# Patient Record
Sex: Male | Born: 1960 | Race: Black or African American | Hispanic: No | Marital: Single | Smoking: Former smoker
Health system: Southern US, Community
[De-identification: ages and names within clinical notes are randomized; demographics above are authoritative.]

## PROBLEM LIST (undated history)

## (undated) DIAGNOSIS — E7800 Pure hypercholesterolemia, unspecified: Secondary | ICD-10-CM

## (undated) DIAGNOSIS — E119 Type 2 diabetes mellitus without complications: Secondary | ICD-10-CM

## (undated) HISTORY — PX: BACK SURGERY: SHX140

## (undated) HISTORY — DX: Pure hypercholesterolemia, unspecified: E78.00

---

## 1999-05-20 ENCOUNTER — Encounter (INDEPENDENT_AMBULATORY_CARE_PROVIDER_SITE_OTHER): Payer: Self-pay | Admitting: Specialist

## 1999-05-20 ENCOUNTER — Observation Stay (HOSPITAL_COMMUNITY): Admission: RE | Admit: 1999-05-20 | Discharge: 1999-05-21 | Payer: Self-pay | Admitting: Specialist

## 1999-05-20 ENCOUNTER — Encounter: Payer: Self-pay | Admitting: Specialist

## 2000-11-25 ENCOUNTER — Other Ambulatory Visit: Admission: RE | Admit: 2000-11-25 | Discharge: 2000-11-25 | Payer: Self-pay | Admitting: Podiatry

## 2003-04-01 ENCOUNTER — Emergency Department (HOSPITAL_COMMUNITY): Admission: EM | Admit: 2003-04-01 | Discharge: 2003-04-02 | Payer: Self-pay | Admitting: Emergency Medicine

## 2003-04-01 ENCOUNTER — Encounter: Payer: Self-pay | Admitting: Emergency Medicine

## 2008-03-26 ENCOUNTER — Observation Stay (HOSPITAL_COMMUNITY): Admission: EM | Admit: 2008-03-26 | Discharge: 2008-03-28 | Payer: Self-pay | Admitting: Emergency Medicine

## 2008-03-27 ENCOUNTER — Ambulatory Visit: Payer: Self-pay | Admitting: Internal Medicine

## 2008-03-28 ENCOUNTER — Encounter: Payer: Self-pay | Admitting: Cardiology

## 2009-02-14 ENCOUNTER — Ambulatory Visit (HOSPITAL_COMMUNITY): Admission: RE | Admit: 2009-02-14 | Discharge: 2009-02-14 | Payer: Self-pay | Admitting: Surgery

## 2011-03-11 LAB — BASIC METABOLIC PANEL
BUN: 24 mg/dL — ABNORMAL HIGH (ref 6–23)
CO2: 25 mEq/L (ref 19–32)
Calcium: 8.6 mg/dL (ref 8.4–10.5)
Chloride: 104 mEq/L (ref 96–112)
Creatinine, Ser: 1.14 mg/dL (ref 0.4–1.5)
GFR calc Af Amer: >60 mL/min (ref >60)
GFR calc non Af Amer: >60 mL/min (ref >60)
Glucose, Bld: 104 mg/dL — ABNORMAL HIGH (ref 70–99)
Potassium: 3.9 mEq/L (ref 3.5–5.1)
Sodium: 135 mEq/L (ref 135–145)

## 2011-03-11 LAB — HEMOGLOBIN AND HEMATOCRIT, BLOOD
HCT: 44.1 % (ref 39.0–52.0)
Hemoglobin: 15.1 g/dL (ref 13.0–17.0)

## 2011-03-11 LAB — GLUCOSE, CAPILLARY: Glucose-Capillary: 90 mg/dL (ref 70–99)

## 2011-04-13 NOTE — H&P (Signed)
NAMEDELBERT, DARLEY NO.:  [id number]   MEDICAL RECORD NO.:  [id number]          PATIENT TYPE:  EMS   LOCATION:  ED                           FACILITY:  [facility]   PHYSICIAN:  Madaline Savage, MD        DATE OF BIRTH:  Oct 03, 1961   DATE OF ADMISSION:  03/26/2008  DATE OF DISCHARGE:                              HISTORY & PHYSICAL   PRIMARY CARE PHYSICIAN:  [facility].  This patient is unassigned to [place].   CHIEF COMPLAINTS:  Chest pain and palpitations.   HISTORY OF PRESENT ILLNESS:  Mr. James Ingram is a 50 year old gentleman with  no significant past medical history, who comes in with chest pain.  He  states the chest pain came on 2 days ago when he was resting on his  couch.  The chest pain was central, it was sharp.  He rated it 5/10, it  had no radiation.  He denies any shortness of breath, any nausea, any  diaphoresis or any sweating with it.  There were aggravating or  relieving factors with the pain.  He thought that the pain was secondary  to gas at that time and so took some water and the pain resolved in 5  minutes.  Since then, he has had episodes of palpitations which lasted  several minutes.  These used to come on and off.  He had an episode of  palpitation while he was in the ER, and at that time his rhythm was  normal sinus rhythm at a rate of 80 per minute.  He states he has been  anxious because of the chest pain.   PAST MEDICAL HISTORY:  None.   PAST SURGICAL HISTORY:  History of back surgery several years ago.   ALLERGIES:  NO KNOWN DRUG ALLERGIES.   CURRENT MEDICATIONS:  None.   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  He smokes  approximately one pack of cigarettes a day and has smoked for 20 years.  He takes alcohol occasionally.  He denies any history of drug abuse.   FAMILY HISTORY:  His father died at the age of 79 of a heart attack.  His mother is 52, she is healthy.   REVIEW OF SYSTEMS:  GENERAL:  He denies recent weight  loss or weight  gain.  No fever or chills.  HEENT:  No headaches, no blurred vision.  No  sore throat.  CARDIOVASCULAR:  Does not have any chest pain now, does  have palpitations occasionally.  RESPIRATORY:  No shortness of breath or  cough.  GI:  No abdominal pain, nausea, vomiting, diarrhea or  constipation.   PHYSICAL EXAMINATION:  GENERAL:  He is alert and oriented x3.  VITAL SIGNS:  Temperature 97.7, pulse rate 76, blood pressure is 137/86,  respiratory rate 16, oxygen saturation 99% on 2 liters.  HEENT:  Head atraumatic, normocephalic.  Pupils bilaterally equal and  reactive to light.  Mucous membranes are moist.  NECK:  Supple.  No JVD, no carotid bruit.  CARDIOVASCULAR:  S1-S2 heard.  Regular rate and rhythm.  No murmurs or  gallops.  CHEST:  Clear to auscultation.  ABDOMEN:  Soft.  Bowel sounds heard.  EXTREMITIES:  No edema, cyanosis or clubbing.   LABORATORY DATA:  Show a white count of 13.5, hemoglobin 15.8, platelets  244.  Sodium 136, potassium 3.3, chloride 99, bicarb 26, BUN 10,  creatinine 1.9, glucose of 190, troponin is less than 0.05.  EKG shows  normal sinus rhythm.  X-ray of the chest shows no acute findings.  Urinalysis is negative.   IMPRESSION:  1. Atypical chest pain.  2. Mild hyperglycemia.  3. Leukocytosis.  4. Hypokalemia.  5. Tobacco use.   PLAN:  This is a 50 year old gentleman who has a history of smoking who  comes in with atypical chest pain.  He is chest pain free at this time.  He also had a palpitations.  In the ED, he was in normal sinus rhythm  while he had these palpitations.  On exam, he does not have any murmurs  at this time.  I will keep him here for 23 observation and get three  sets of cardiac markers on him.  He does have a slightly elevated sugar  of 190.  He will get a fasting blood sugar along with his BNP tomorrow  morning to find if he does have diabetes.  I will also get hemoglobin  A1c on him.  I will also get TSH and a  fasting lipid profile.  He does  have a slightly elevated white cell count, which could be a stress  reaction.  We will follow it up with a.m. labs.  His potassium is on the  lower side.  Will replace the potassium and will give him smoking  cessation counseling.  I will put him on DVT prophylaxis.      Madaline Savage, MD  Electronically Signed     PKN/MEDQ  D:  03/26/2008  T:  03/26/2008  Job:  [id number]

## 2011-04-13 NOTE — Discharge Summary (Signed)
NAMEDAL, BLEW NO.:  [id number]   MEDICAL RECORD NO.:  [id number]          PATIENT TYPE:  OUT   LOCATION:  NUC                          FACILITY:  [facility]   PHYSICIAN:  Altha Harm, MDDATE OF BIRTH:  10-01-1961   DATE OF ADMISSION:  03/28/2008  DATE OF DISCHARGE:  03/28/2008                               DISCHARGE SUMMARY   DISCHARGE DISPOSITION:  Home.   FINAL DISCHARGE DIAGNOSES:  1. Chest pain noncardiac.  2. Diabetes type 2, new diagnosis.  3. Hyperlipidemia, new diagnosis.  4. Stage I hypertension.   DISCHARGE MEDICATIONS:  1. Zocor 40 mg p.o. nightly.  2. Glucophage 250 mg p.o. b.i.d.  3. Aspirin 81 mg p.o. daily.  4. Lisinopril 10 mg p.o. daily.   PROCEDURES:  None.   DIAGNOSTIC STUDIES:  1. Treadmill Myoview which shows no ischemia and normal ejection      fraction.  2. Chest x-ray two-view done on admission which shows no acute chest      findings.   CONSULTANTS:  [facility] for stress test procedure.   CHIEF COMPLAINT:  Chest pain and palpitations.   HISTORY OF PRESENT ILLNESS:  Please see the H&P dictated by Dr. Oneita Hurt for details of the HPI.   HOSPITAL COURSE:  1. The patient was hospitalized and ruled out with serial enzymes for      resting ischemia.  The patient then underwent a stress Myoview,      which shows no ischemia and normal ejection fraction.  In the      course of the workup, the patient was found to have hyperlipidemia      with an LDL elevated to 111 and low HDL of 37.  The patient was      started on Zocor for cardiac protection as well as risk factor      modification.  The patient also was found to have a borderline      hemoglobin A1c at 6.2.  This was discussed with the patient between      diet modification and low dose Glucophage.  The patient has opted      for low-dose Glucophage and is being prescribed a Glucometer to      monitor his blood sugars.  2. Stage I hypertension.  The  patient is started on lisinopril 10 mg      p.o. daily.   The patient has been stable throughout his hospital stay and without any  incident.  The patient has tolerated his meals well.  He is ambulating  without any difficulty and he has been pain-free for 48 hours.   PLAN:  Is to discharge the patient home today.   DIETARY RESTRICTIONS:  The patient should be on a diabetic heart-healthy  diet.  He has been instructed in diet modification.   PHYSICAL RESTRICTIONS:  None.   FOLLOWUP:  The patient coming into the hospital did not have a primary  care physician.  However, the patient will make an appointment to follow  up with Dr. Lovell Sheehan, phone number [phone].      Marcelino Duster  Neita Garnet, MD  Electronically Signed     MAM/MEDQ  D:  03/28/2008  T:  03/28/2008  Job:  [id number]   cc:   Dr. Lovell Sheehan

## 2011-04-13 NOTE — Op Note (Signed)
James Ingram, James Ingram NO.:  [id number]   MEDICAL RECORD NO.:  [id number]          PATIENT TYPE:  AMB   LOCATION:  DAY                          FACILITY:  [facility]   PHYSICIAN:  Ardeth Sportsman, MD     DATE OF BIRTH:  [birth year]   DATE OF PROCEDURE:  02/14/2009  DATE OF DISCHARGE:                               OPERATIVE REPORT   PRIMARY CARE PHYSICIAN:  Della Goo, M.D.   SURGEON:  Karie Soda, M.D.   PREOPERATIVE DIAGNOSES:  1. Left inguinal hernia.  2. Possible small right inguinal hernia.  3. Umbilical hernia.   POSTOPERATIVE DIAGNOSES:  1. Bilateral inguinal herniae.  2. Umbilical hernia.   PROCEDURES PERFORMED:  1. Laparoscopic bilateral preperitoneal exploration with hernia      repairs with mesh.  2. Primary umbilical hernia repair.   ANESTHESIA:  1. General anesthesia.  2. Bilateral ilioinguinal/genitofemoral/spermatic cord nerve blocks.   DRAINS:  None.   ESTIMATED BLOOD LOSS:  30 mL.   COMPLICATIONS:  None apparent.   INDICATIONS:  James Ingram is a pleasant 50 year old male who is rather  physically active.  He has had an enlarging left groin mass with some  discomfort with it.  He normally is very physically active and it has  limited his ability to do his job, which involves a lot of heavy  lifting.  On my initial examination I did not detect a right inguinal  hernia, but today I feel an area that is suspicious.  He has a small  hernia through his umbilical stalk as well.   Anatomy and physiology of abdominal formation and testicular migration  was explained.  Pathophysiology of herniation with its natural history  of risks were discussed.  Options were discussed.  Recommendation was  made for laparoscopic bilateral inguinal exploration with hernia repairs  as needed and umbilical hernia repair.  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  Risks and benefits,  alternatives were discussed.  Questions were answered.  He agreed to proceed.   OPERATIVE FINDINGS:  He had a moderately enlarged left inguinal hernia  with a moderate-sized spermatic cord lipoma in the indirect region.  He  did not have any significant direct defects.  On the right he had a  small, but definite, indirect defect as well.  He had a umbilical hernia  through the stalk itself, about 5 mm in size.   DESCRIPTION OF PROCEDURE:  Consent was confirmed.  The patient voided in  short-stay, but was not able to void just prior to surgery.  He received  IV antibiotics just prior to surgery.  He had sequential pressure  devices active during the entire case.  He underwent general anesthesia  without any difficulty.  He was positioned supine, both arms tucked.  His abdomen and perineum were clipped, prepped and draped in a sterile  fashion.  Surgical time-out confirmed our plan.   Entry was gained to the abdominal wall through an infraumbilical  curvilinear incision.  A nick was made  through the anterior the fascia,  just to the right of midline, and I was able to place a 12-mm Hasson  port in the right retrorectal/preperitoneal plane.  Capnopreperitoneum  to 15 mmHg provided good intra-abdominal wall insufflation.  Camera  inspection revealed correct placement.  Camera dissection was used to  help free the peritoneum off bilateral lower quadrants.  Enough working  space was created such that 5 mm ports could be placed in the right and  left midabdomen.   Attention was turned towards the left side since that was the more  obvious side.  Peritoneum was freed off laterally.  In doing that, there  was a tear in the peritoneum during dissection.  This was closed using a  3-0 Vicryl stitch, using laparoscopic intracorporeal suturing.  Peritoneum was followed up towards the internal ring and an obvious  indirect hernia could be seen.  The hernia sac was freed off the cord  structures and  peeled back and reduced out of the inguinal canal.  A  moderate-sized spermatic cord was not as well and this was carefully  peeled off and reduced back out, as well.  Peritoneum was peeled off the  cord structures and the retroperitoneum as proximally as possible.  A  window was made between the lateral bladder and the anteromedial pelvic  wall.  The bladder was rather full.   Attention was turned toward the right side.  Dissection was carried out  in a mirror-image fashion.  There was a a more subtle hernia sac, but it  was definitely going into the internal ring.  This was an indirect  hernia as well.  There was no evidence of any direct femoral defects on  the right either.  A small tear was made in the hernia sac during  dissection.  This was closed laparoscopically using a 3-0 Vicryl stitch  and using intracorporeal suturing to good result.   A 15 x 15 cm ultra-lightweight polypropylene (Ultrapro) mesh was used to  each side.  It was cut with a slit and into half-skull shape.  It was  positioned such that a 3 x 3 cm medial inferior flap rested in the true  pelvis between the lateral bladder wall and medial pelvic wall.  The  mesh laid well proximally, laterally, superiorly and medially, such that  there was 3 inches circumferential coverage around the internal ring.  Mesh was placed in a mirror-image fashion on the right side as well.  The mesh was held down as hernia sacs were pulled cephalad with the mesh  held in place as capnopreperitoneum was evacuated.  Ports were removed.   Attention was turned to the umbilical hernia repair.  Dissection was  used to help free the umbilical stalk circumferentially.  Umbilical  stalk was freed off the fascia.  In doing that, there was obvious hernia  that had gone through the umbilical stalk that had some omentum within  it, but it was not incarcerated.  The hernia was reduced down.  It was  closed using 0 Vicryl interrupted stitches to good  result.  The fascial  defect for the Hasson port was closed with 0Vicryl stitch as well.  The  umbilical stalk was re-tacked to the fascia using a 4-0 Monocryl stitch.  Skin was closed using 4-0 Monocryl stitch.  Sterile dressing was  applied.   Given the fact he a very large bladder, he had I and O catheterization  done at the end of the procedure to  have his bladder nice and  decompressed.  He was extubated and taken to the recovery room in stable  condition.  He tolerated the procedure well.   I discussed postoperative care with the patient in detail in our office  prior to surgery and just prior to surgery in the holding area and will  discuss with his family as well, per his wishes.      Ardeth Sportsman, MD  Electronically Signed     SCG/MEDQ  D:  02/14/2009  T:  02/14/2009  Job:  [id number]   cc:   Della Goo, M.D.  Fax: [phone]

## 2011-08-24 LAB — CBC
HCT: 45.5
HCT: 45.8
Hemoglobin: 15.1
Hemoglobin: 15.8
MCHC: 33.3
MCHC: 34.5
MCV: 92.3
MCV: 92.4
Platelets: 244
Platelets: 250
RBC: 4.93
RBC: 4.96
RDW: 13.7
RDW: 14
WBC: 13.5 — ABNORMAL HIGH
WBC: 9.4

## 2011-08-24 LAB — TROPONIN I: Troponin I: 0.04

## 2011-08-24 LAB — CARDIAC PANEL(CRET KIN+CKTOT+MB+TROPI)
CK, MB: 1.2
CK, MB: 1.5
CK, MB: 1.5
Relative Index: 0.6
Relative Index: 0.7
Relative Index: 1.1
Total CK: 114
Total CK: 210
Total CK: 231
Troponin I: 0.02
Troponin I: 0.03
Troponin I: 0.04

## 2011-08-24 LAB — HEMOGLOBIN A1C
Hgb A1c MFr Bld: 6.2 — ABNORMAL HIGH
Mean Plasma Glucose: 143

## 2011-08-24 LAB — DIFFERENTIAL
Basophils Absolute: 0
Basophils Absolute: 0.1
Basophils Relative: 1
Basophils Relative: 1
Eosinophils Absolute: 0.1
Eosinophils Absolute: 0.2
Eosinophils Relative: 1
Eosinophils Relative: 2
Lymphocytes Relative: 16
Lymphocytes Relative: 38
Lymphs Abs: 2.1
Lymphs Abs: 3.6
Monocytes Absolute: 0.9
Monocytes Absolute: 1
Monocytes Relative: 10
Monocytes Relative: 8
Neutro Abs: 10.1 — ABNORMAL HIGH
Neutro Abs: 4.7
Neutrophils Relative %: 50
Neutrophils Relative %: 75

## 2011-08-24 LAB — URINALYSIS, ROUTINE W REFLEX MICROSCOPIC
Glucose, UA: NEGATIVE
Ketones, ur: >80 — AB
Leukocytes, UA: NEGATIVE
Nitrite: NEGATIVE
Protein, ur: NEGATIVE
Specific Gravity, Urine: 1.026
Urobilinogen, UA: 1
pH: 5.5

## 2011-08-24 LAB — BASIC METABOLIC PANEL
BUN: 10
BUN: 7
CO2: 26
CO2: 28
Calcium: 8.8
Calcium: 9
Chloride: 105
Chloride: 99
Creatinine, Ser: 1.01
Creatinine, Ser: 1.1
GFR calc Af Amer: >60
GFR calc Af Amer: >60
GFR calc non Af Amer: >60
GFR calc non Af Amer: >60
Glucose, Bld: 190 — ABNORMAL HIGH
Glucose, Bld: 95
Potassium: 3.3 — ABNORMAL LOW
Potassium: 4
Sodium: 136
Sodium: 142

## 2011-08-24 LAB — URINE MICROSCOPIC-ADD ON

## 2011-08-24 LAB — POCT CARDIAC MARKERS
CKMB, poc: 1.2
Myoglobin, poc: 63
Operator id: 3067
Troponin i, poc: <0.05

## 2011-08-24 LAB — CK TOTAL AND CKMB (NOT AT ARMC)
CK, MB: 2.2
Relative Index: 1
Total CK: 226

## 2011-08-24 LAB — LIPID PANEL
Cholesterol: 176
HDL: 37 — ABNORMAL LOW
LDL Cholesterol: 111 — ABNORMAL HIGH
Total CHOL/HDL Ratio: 4.8
Triglycerides: 139
VLDL: 28

## 2011-08-24 LAB — TSH: TSH: 2.196

## 2011-12-13 ENCOUNTER — Telehealth (INDEPENDENT_AMBULATORY_CARE_PROVIDER_SITE_OTHER): Payer: Self-pay

## 2011-12-15 NOTE — Telephone Encounter (Signed)
Called patient to answer any pre-surgical questions.

## 2012-06-22 ENCOUNTER — Other Ambulatory Visit: Payer: Self-pay | Admitting: Internal Medicine

## 2012-06-22 DIAGNOSIS — K829 Disease of gallbladder, unspecified: Secondary | ICD-10-CM

## 2012-06-22 DIAGNOSIS — K8689 Other specified diseases of pancreas: Secondary | ICD-10-CM

## 2012-06-22 DIAGNOSIS — K859 Acute pancreatitis without necrosis or infection, unspecified: Secondary | ICD-10-CM

## 2012-06-26 ENCOUNTER — Ambulatory Visit
Admission: RE | Admit: 2012-06-26 | Discharge: 2012-06-26 | Disposition: A | Discharge status: Home / Self Care | Payer: BC Managed Care – PPO | Source: Ambulatory Visit | Attending: Internal Medicine | Admitting: Internal Medicine

## 2012-06-26 DIAGNOSIS — K859 Acute pancreatitis without necrosis or infection, unspecified: Secondary | ICD-10-CM

## 2012-06-26 DIAGNOSIS — K829 Disease of gallbladder, unspecified: Secondary | ICD-10-CM

## 2012-06-26 DIAGNOSIS — K8689 Other specified diseases of pancreas: Secondary | ICD-10-CM

## 2015-06-24 ENCOUNTER — Encounter: Payer: Self-pay | Admitting: Podiatry

## 2015-06-24 ENCOUNTER — Ambulatory Visit (INDEPENDENT_AMBULATORY_CARE_PROVIDER_SITE_OTHER): Payer: BLUE CROSS/BLUE SHIELD | Admitting: Podiatry

## 2015-06-24 VITALS — BP 114/80 | HR 66 | Resp 18

## 2015-06-24 DIAGNOSIS — L84 Corns and callosities: Secondary | ICD-10-CM

## 2015-06-24 NOTE — Progress Notes (Signed)
   Subjective:    Patient ID: James Ingram, male    DOB: 1960-12-16, 54 y.o.   MRN: [id number]  HPI 4TH AND 5TH TOES HAD A SPLIT ABOUT A MONTH AGO AND THEY ARE BETTER AND THE 2ND AND 3RD TOES THERE IS A SPLIT IN BETWEEN THEM AND IT HAS SOME DRYNESS AND PEELING AND CRACKING TO IT AND HAS HAPPENED Sunday AND USED NEOSPORIN This patient presents to the office saying he has break in skin between his second and third toes left foot.  He admits to skin hydration problems.He says he previously had surgery 4,5 right foot and last month had similar problem 4,5 left foot. He has been applying neosporin to the site of the skin lesion.He presents for evaluation and treatment.   Review of Systems  All other systems reviewed and are negative.      Objective:   Physical Exam GENERAL APPEARANCE: Alert, conversant. Appropriately groomed. No acute distress.  VASCULAR: Pedal pulses palpable at 2/4 DP and PT bilateral.  Capillary refill time is immediate to all digits,  Proximal to distal cooling it warm to warm.  Digital hair growth is present bilateral  NEUROLOGIC: sensation is intact epicritically and protectively to 5.07 monofilament at 5/5 sites bilateral.  Light touch is intact bilateral, vibratory sensation intact bilateral, achilles tendon reflex is intact bilateral.  MUSCULOSKELETAL: acceptable muscle strength, tone and stability bilateral.  Intrinsic muscluature intact bilateral.  Rectus appearance of foot and digits noted bilateral.   DERMATOLOGIC: skin color, texture, and turgor are within normal limits.  No preulcerative lesions or ulcers  are seen, no interdigital maceration noted.  There is interspace lesion in second interspace left foot.  No redness or swelling or infection noted.  Digital nails are asymptomatic. No drainage noted.         Assessment & Plan:  Interdigital ulceration left foot.  IE.  Disvcussed condition with patient.  Neosporin/DSD applied.

## 2015-10-20 ENCOUNTER — Ambulatory Visit
Admission: RE | Admit: 2015-10-20 | Discharge: 2015-10-20 | Disposition: A | Discharge status: Home / Self Care | Payer: BLUE CROSS/BLUE SHIELD | Source: Ambulatory Visit | Attending: Internal Medicine | Admitting: Internal Medicine

## 2015-10-20 ENCOUNTER — Other Ambulatory Visit: Payer: Self-pay | Admitting: Internal Medicine

## 2015-10-20 DIAGNOSIS — R05 Cough: Secondary | ICD-10-CM

## 2015-10-20 DIAGNOSIS — R059 Cough, unspecified: Secondary | ICD-10-CM

## 2018-02-20 ENCOUNTER — Other Ambulatory Visit: Payer: Self-pay | Admitting: Internal Medicine

## 2018-02-20 DIAGNOSIS — Z87891 Personal history of nicotine dependence: Secondary | ICD-10-CM

## 2018-03-06 ENCOUNTER — Ambulatory Visit
Admission: RE | Admit: 2018-03-06 | Discharge: 2018-03-06 | Disposition: A | Discharge status: Home / Self Care | Payer: BLUE CROSS/BLUE SHIELD | Source: Ambulatory Visit | Attending: Internal Medicine | Admitting: Internal Medicine

## 2018-03-06 DIAGNOSIS — Z87891 Personal history of nicotine dependence: Secondary | ICD-10-CM

## 2019-07-02 ENCOUNTER — Other Ambulatory Visit: Payer: Self-pay | Admitting: Internal Medicine

## 2019-07-02 DIAGNOSIS — Z122 Encounter for screening for malignant neoplasm of respiratory organs: Secondary | ICD-10-CM

## 2019-08-03 ENCOUNTER — Ambulatory Visit
Admission: RE | Admit: 2019-08-03 | Discharge: 2019-08-03 | Disposition: A | Discharge status: Home / Self Care | Payer: BC Managed Care – PPO | Source: Ambulatory Visit | Attending: Internal Medicine | Admitting: Internal Medicine

## 2019-08-03 ENCOUNTER — Other Ambulatory Visit: Payer: Self-pay

## 2019-08-03 DIAGNOSIS — Z122 Encounter for screening for malignant neoplasm of respiratory organs: Secondary | ICD-10-CM

## 2021-02-06 ENCOUNTER — Other Ambulatory Visit: Payer: Self-pay | Admitting: Internal Medicine

## 2021-02-09 ENCOUNTER — Other Ambulatory Visit: Payer: Self-pay | Admitting: Internal Medicine

## 2021-02-09 DIAGNOSIS — F17200 Nicotine dependence, unspecified, uncomplicated: Secondary | ICD-10-CM

## 2021-03-02 ENCOUNTER — Inpatient Hospital Stay: Admission: RE | Admit: 2021-03-02 | Payer: BC Managed Care – PPO | Source: Ambulatory Visit

## 2021-03-02 ENCOUNTER — Ambulatory Visit
Admission: RE | Admit: 2021-03-02 | Discharge: 2021-03-02 | Disposition: A | Discharge status: Home / Self Care | Payer: BC Managed Care – PPO | Source: Ambulatory Visit | Attending: Internal Medicine | Admitting: Internal Medicine

## 2021-03-02 DIAGNOSIS — F17200 Nicotine dependence, unspecified, uncomplicated: Secondary | ICD-10-CM

## 2022-08-13 IMAGING — CT CT CHEST LUNG CANCER SCREENING LOW DOSE W/O CM
2 of 5 series · 14 of 40 positions shown, 17 images · non-contrast
Comparison: Low-dose lung cancer screening chest CT 08/03/2019.

CLINICAL DATA: 59-year-old male current smoker with 36 pack-year
history of smoking. Lung cancer screening examination.

EXAM:
CT CHEST WITHOUT CONTRAST LOW-DOSE FOR LUNG CANCER SCREENING
TECHNIQUE: Multidetector CT imaging of the chest was performed following the
standard protocol without IV contrast.

[Series 4: lung 1.00 br44 cor · coronal · 0.65mm/px · 3 of 325 slices shown]
[im 65/325  lung]
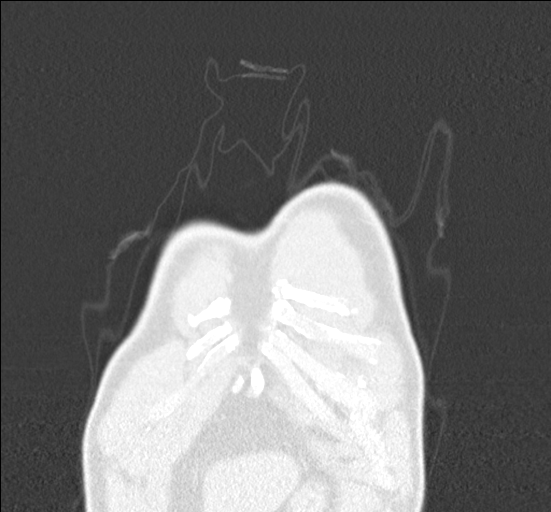
[im 130/325  lung]
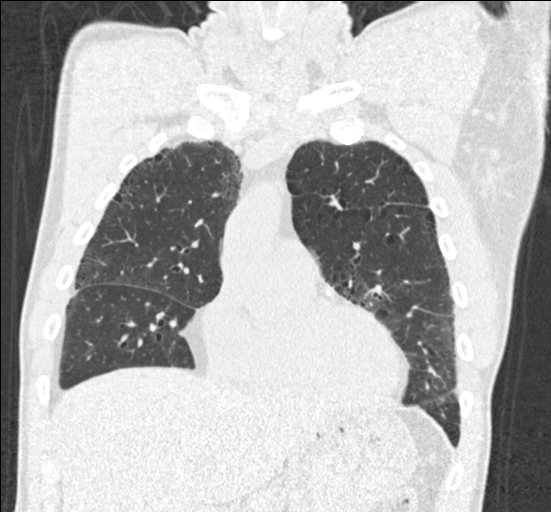
[im 195/325  lung]
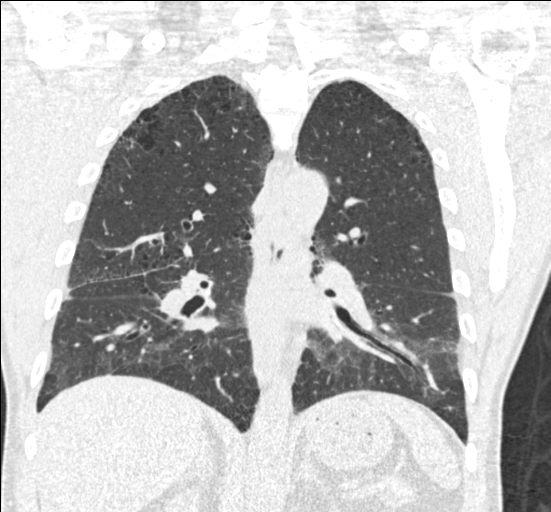

[Series 9: lung 1.00 br60 axial · axial · 0.70mm/px · z∈[-1178,-878]mm · 11 of 332 slices shown, 14 images]
[im 16/332  mediastinal]
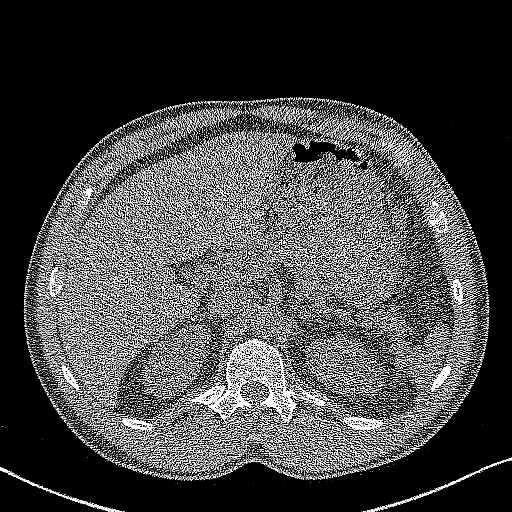
[im 16/332  lung]
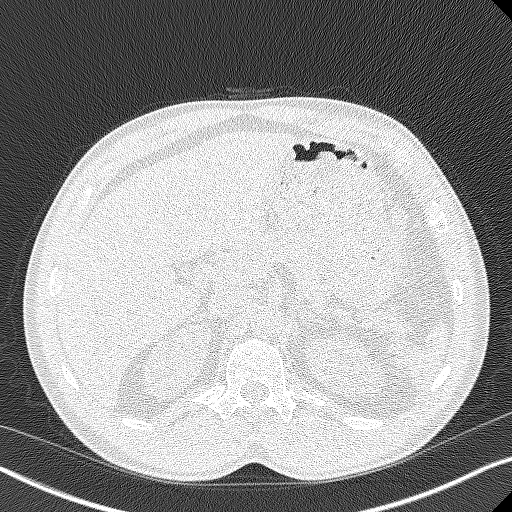
[im 46/332  lung]
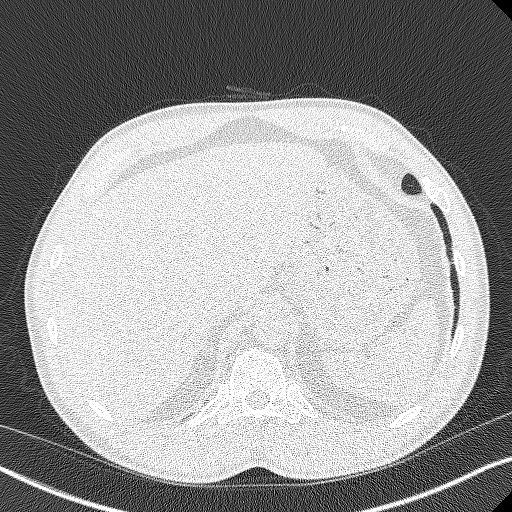
[im 76/332  lung]
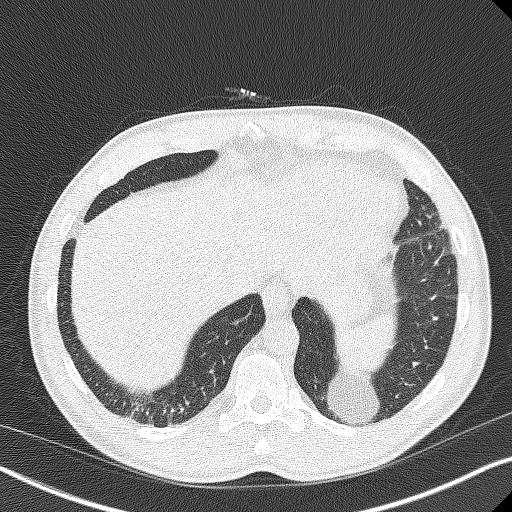
[im 106/332  lung]
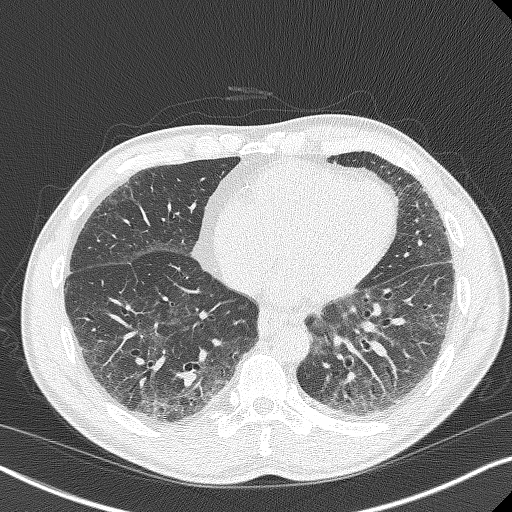
[im 136/332  mediastinal]
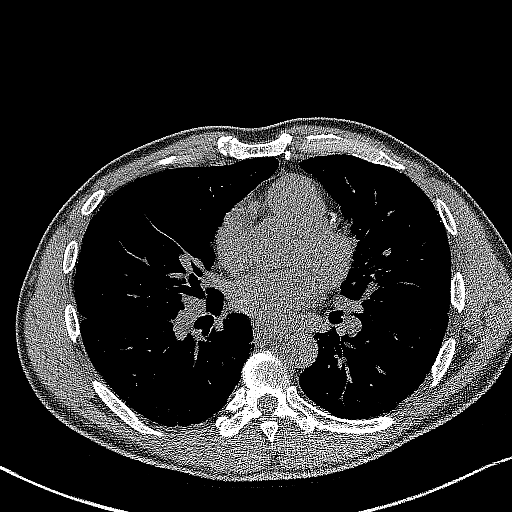
[im 136/332  lung]
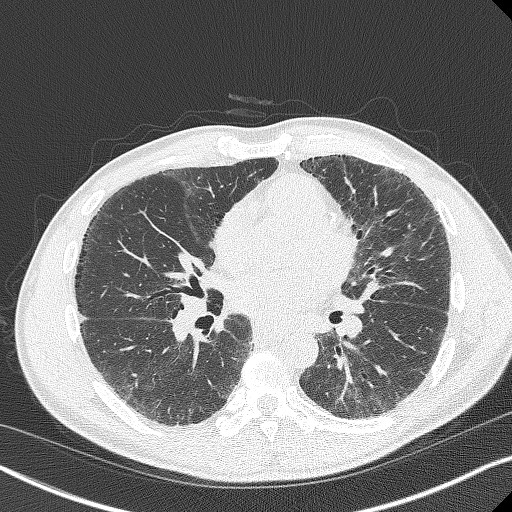
[im 166/332  lung]
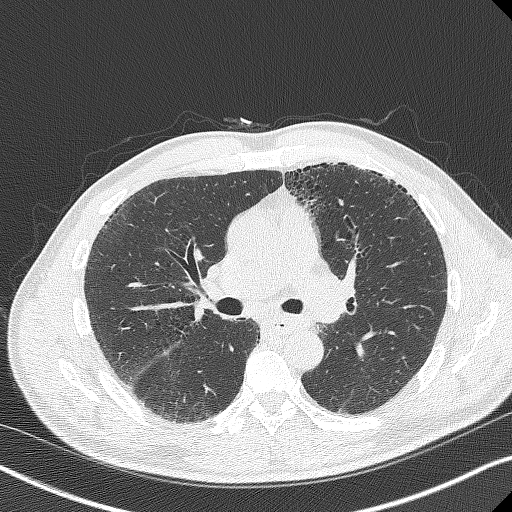
[im 196/332  lung]
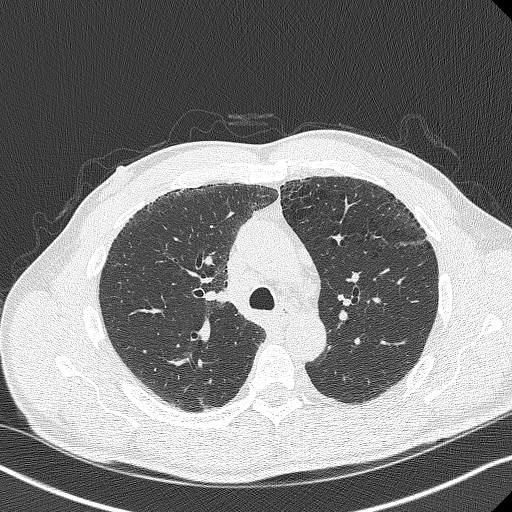
[im 226/332  lung]
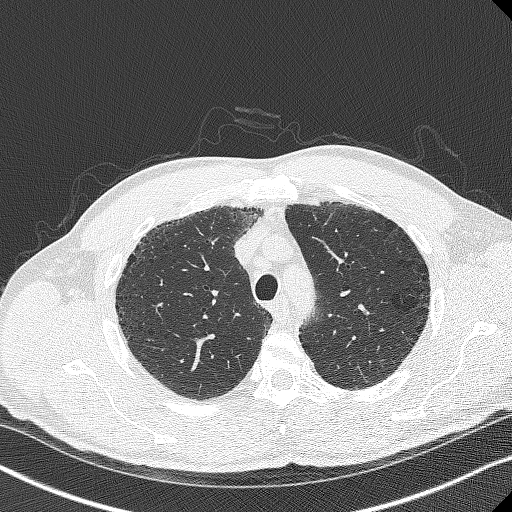
[im 256/332  mediastinal]
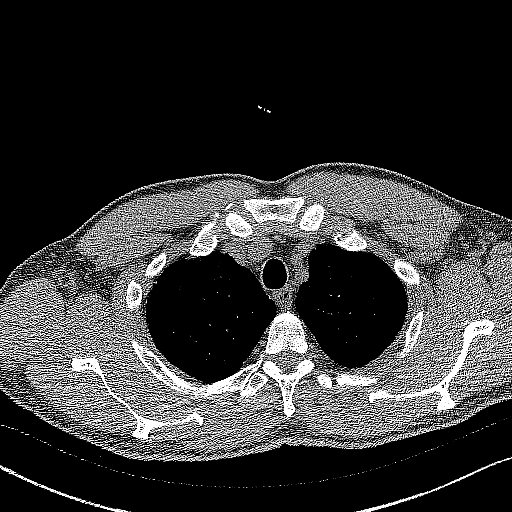
[im 256/332  lung]
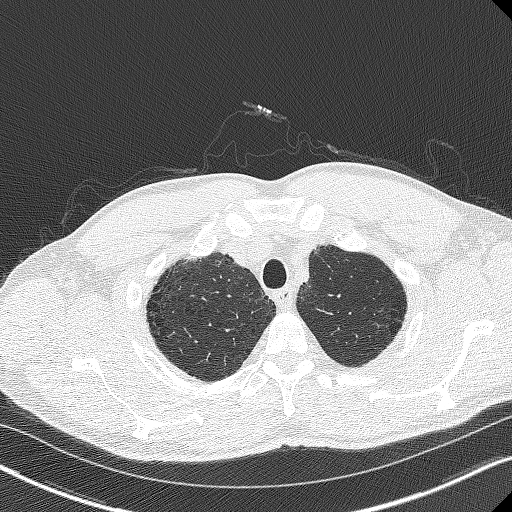
[im 286/332  lung]
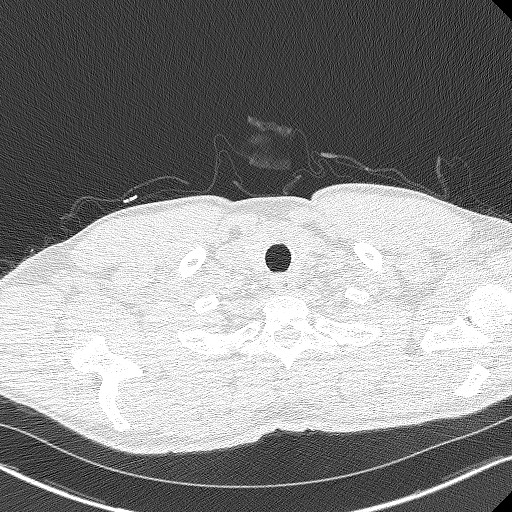
[im 316/332  lung]
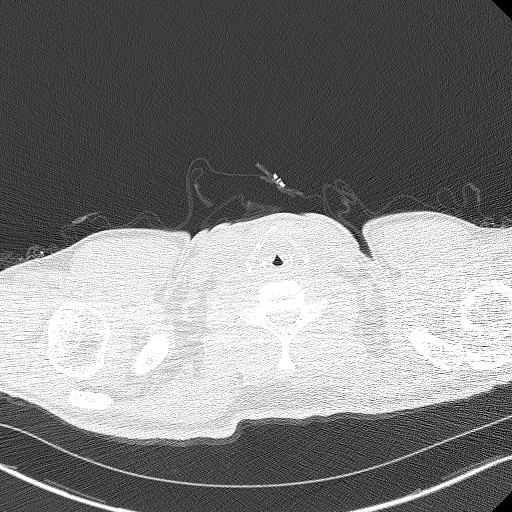

[14 of 40 positions shown; findings below may reference images not displayed]

FINDINGS: Cardiovascular: Heart size is normal. There is no significant
pericardial fluid, thickening or pericardial calcification. There is
aortic atherosclerosis, as well as atherosclerosis of the great
vessels of the mediastinum and the coronary arteries, including
calcified atherosclerotic plaque in the left anterior descending,
left circumflex and right coronary arteries.

Mediastinum/Nodes: No pathologically enlarged mediastinal or hilar
lymph nodes. Please note that accurate exclusion of hilar adenopathy
is limited on noncontrast CT scans. Esophagus is unremarkable in
appearance. No axillary lymphadenopathy.

Lungs/Pleura: Tiny pulmonary nodule in the periphery of the left
upper lobe (axial image 75 of series 3), with a volume derived mean
diameter of 1.2 mm. No other larger more suspicious appearing
pulmonary nodules or masses are noted. No acute consolidative
airspace disease. No pleural effusions. Mild diffuse bronchial wall
thickening with mild centrilobular and paraseptal emphysema.
Widespread areas of ground-glass attenuation and septal thickening
are again noted in the lower lungs bilaterally, similar to prior
studies.

Upper Abdomen: Unremarkable.

Musculoskeletal: There are no aggressive appearing lytic or blastic
lesions noted in the visualized portions of the skeleton.
IMPRESSION: 1. Lung-RADS 2S, benign appearance or behavior. Continue annual
screening with low-dose chest CT without contrast in 12 months.
2. The "S" modifier above refers to potentially clinically
significant non lung cancer related findings. Specifically, there is
aortic atherosclerosis, in addition to 3 vessel coronary artery
disease. Please note that although the presence of coronary artery
calcium documents the presence of coronary artery disease, the
severity of this disease and any potential stenosis cannot be
assessed on this non-gated CT examination. Assessment for potential
risk factor modification, dietary therapy or pharmacologic therapy
may be warranted, if clinically indicated.
3. Mild diffuse bronchial wall thickening with mild centrilobular
and paraseptal emphysema; imaging findings suggestive of underlying
COPD.
4. In addition, there is ground-glass attenuation and septal
thickening in the lung bases, similar to prior studies, favored to
reflect an interstitial lung disease such as nonspecific
interstitial pneumonia (NSIP). Outpatient referral to Pulmonology
for further clinical evaluation is recommended.

Aortic Atherosclerosis (PHDYJ-2L5.5) and Emphysema (PHDYJ-FRJ.Q).

## 2022-09-08 ENCOUNTER — Emergency Department (HOSPITAL_BASED_OUTPATIENT_CLINIC_OR_DEPARTMENT_OTHER)
Admission: EM | Admit: 2022-09-08 | Discharge: 2022-09-09 | Disposition: A | Discharge status: Home / Self Care | Payer: BC Managed Care – PPO | Attending: Emergency Medicine | Admitting: Emergency Medicine

## 2022-09-08 ENCOUNTER — Emergency Department (HOSPITAL_BASED_OUTPATIENT_CLINIC_OR_DEPARTMENT_OTHER): Payer: BC Managed Care – PPO | Admitting: Radiology

## 2022-09-08 ENCOUNTER — Encounter (HOSPITAL_BASED_OUTPATIENT_CLINIC_OR_DEPARTMENT_OTHER): Payer: Self-pay

## 2022-09-08 ENCOUNTER — Other Ambulatory Visit: Payer: Self-pay

## 2022-09-08 ENCOUNTER — Ambulatory Visit (HOSPITAL_COMMUNITY)
Admission: EM | Admit: 2022-09-08 | Discharge: 2022-09-08 | Disposition: A | Discharge status: Home / Self Care | Payer: BC Managed Care – PPO | Attending: Family Medicine | Admitting: Family Medicine

## 2022-09-08 ENCOUNTER — Encounter (HOSPITAL_COMMUNITY): Payer: Self-pay

## 2022-09-08 DIAGNOSIS — R55 Syncope and collapse: Secondary | ICD-10-CM | POA: Diagnosis present

## 2022-09-08 DIAGNOSIS — E119 Type 2 diabetes mellitus without complications: Secondary | ICD-10-CM | POA: Diagnosis not present

## 2022-09-08 HISTORY — DX: Type 2 diabetes mellitus without complications: E11.9

## 2022-09-08 LAB — URINALYSIS, ROUTINE W REFLEX MICROSCOPIC
Bilirubin Urine: NEGATIVE
Glucose, UA: NEGATIVE mg/dL
Hgb urine dipstick: NEGATIVE
Ketones, ur: NEGATIVE mg/dL
Leukocytes,Ua: NEGATIVE
Nitrite: NEGATIVE
Protein, ur: NEGATIVE mg/dL
Specific Gravity, Urine: 1.019 (ref 1.005–1.030)
pH: 5.5 (ref 5.0–8.0)

## 2022-09-08 LAB — RAPID URINE DRUG SCREEN, HOSP PERFORMED
Amphetamines: NOT DETECTED
Barbiturates: NOT DETECTED
Benzodiazepines: NOT DETECTED
Cocaine: NOT DETECTED
Opiates: NOT DETECTED
Tetrahydrocannabinol: NOT DETECTED

## 2022-09-08 LAB — BASIC METABOLIC PANEL
Anion gap: 8 (ref 5–15)
BUN: 12 mg/dL (ref 6–20)
CO2: 28 mmol/L (ref 22–32)
Calcium: 9.4 mg/dL (ref 8.9–10.3)
Chloride: 102 mmol/L (ref 98–111)
Creatinine, Ser: 1.23 mg/dL (ref 0.61–1.24)
GFR, Estimated: >60 mL/min (ref >60)
Glucose, Bld: 132 mg/dL — ABNORMAL HIGH (ref 70–99)
Potassium: 3.7 mmol/L (ref 3.5–5.1)
Sodium: 138 mmol/L (ref 135–145)

## 2022-09-08 LAB — CBC
HCT: 42 % (ref 39.0–52.0)
Hemoglobin: 14.2 g/dL (ref 13.0–17.0)
MCH: 30.2 pg (ref 26.0–34.0)
MCHC: 33.8 g/dL (ref 30.0–36.0)
MCV: 89.4 fL (ref 80.0–100.0)
Platelets: 239 10*3/uL (ref 150–400)
RBC: 4.7 MIL/uL (ref 4.22–5.81)
RDW: 13.8 % (ref 11.5–15.5)
WBC: 7.8 10*3/uL (ref 4.0–10.5)
nRBC: 0 % (ref 0.0–0.2)

## 2022-09-08 LAB — TROPONIN I (HIGH SENSITIVITY): Troponin I (High Sensitivity): 5 ng/L (ref <18)

## 2022-09-08 LAB — CBG MONITORING, ED: Glucose-Capillary: 117 mg/dL — ABNORMAL HIGH (ref 70–99)

## 2022-09-08 MED ORDER — NAPROXEN 250 MG PO TABS
500.0000 mg | ORAL_TABLET | Freq: Once | ORAL | Status: AC
  Administered 2022-09-08: 500 mg via ORAL
  Filled 2022-09-08: qty 2
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
## 2022-09-08 NOTE — Discharge Instructions (Signed)
Please proceed to the ER 
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
## 2022-09-08 NOTE — Progress Notes (Signed)
  [facility] will assume care on arrival to accepting facility. Until arrival, care as per EDP. However, [facility] available 24/7 for questions and assistance.   Nursing staff please page [facility] Admits and Consults ([phone]) as soon as the patient arrives to the hospital.  Kayce Chismar, DO [facility]  

## 2022-09-09 ENCOUNTER — Ambulatory Visit (INDEPENDENT_AMBULATORY_CARE_PROVIDER_SITE_OTHER): Payer: BC Managed Care – PPO | Admitting: Internal Medicine

## 2022-09-09 ENCOUNTER — Encounter: Payer: Self-pay | Admitting: Internal Medicine

## 2022-09-09 ENCOUNTER — Ambulatory Visit (INDEPENDENT_AMBULATORY_CARE_PROVIDER_SITE_OTHER): Payer: BC Managed Care – PPO

## 2022-09-09 VITALS — BP 134/72 | HR 67 | Ht 68.0 in | Wt 178.8 lb

## 2022-09-09 DIAGNOSIS — R55 Syncope and collapse: Secondary | ICD-10-CM
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
## 2022-09-09 NOTE — ED Notes (Signed)
Introduced self to Pt and made sure bed was locked and rail secured.

## 2022-09-09 NOTE — Patient Instructions (Signed)
Medication Instructions:  No Changes In Medications at this time.  *If you need a refill on your cardiac medications before your next appointment, please call your pharmacy*  Lab Work: None Ordered At This Time.  If you have labs (blood work) drawn today and your tests are completely normal, you will receive your results only by: [place] (if you have MyChart) OR A paper copy in the mail If you have any lab test that is abnormal or we need to change your treatment, we will call you to review the results.  Testing/Procedures:  [place]- Long Term Monitor Instructions   Your physician has requested you wear your ZIO patch monitor___14____days.   This is a single patch monitor.  [employer] supplies one patch monitor per enrollment.  Additional stickers are not available.   Please do not apply patch if you will be having a Nuclear Stress Test, Echocardiogram, Cardiac CT, MRI, or Chest Xray during the time frame you would be wearing the monitor. The patch cannot be worn during these tests.  You cannot remove and re-apply the ZIO XT patch monitor.   Your ZIO patch monitor will be sent USPS Priority mail from [facility] directly to your home address. The monitor may also be mailed to a PO BOX if home delivery is not available.   It may take 3-5 days to receive your monitor after you have been enrolled.   Once you have received you monitor, please review enclosed instructions.  Your monitor has already been registered assigning a specific monitor serial # to you.   Applying the monitor   Shave hair from upper left chest.   Hold abrader disc by orange tab.  Rub abrader in 40 strokes over left upper chest as indicated in your monitor instructions.   Clean area with 4 enclosed alcohol pads .  Use all pads to assure are is cleaned thoroughly.  Let dry.   Apply patch as indicated in monitor instructions.  Patch will be place under collarbone on left side of chest with arrow pointing  upward.   Rub patch adhesive wings for 2 minutes.Remove white label marked "1".  Remove white label marked "2".  Rub patch adhesive wings for 2 additional minutes.   While looking in a mirror, press and release button in center of patch.  A small green light will flash 3-4 times .  This will be your only indicator the monitor has been turned on.     Do not shower for the first 24 hours.  You may shower after the first 24 hours.   Press button if you feel a symptom. You will hear a small click.  Record Date, Time and Symptom in the Patient Log Book.   When you are ready to remove patch, follow instructions on last 2 pages of Patient Log Book.  Stick patch monitor onto last page of Patient Log Book.   Place Patient Log Book in [place] box.  Use locking tab on box and tape box closed securely.  The [employer] has [employer] on it.  Please place in mailbox as soon as possible.  Your physician should have your test results approximately 7 days after the monitor has been mailed back to [facility].   Call [place] at [phone] if you have questions regarding your ZIO XT patch monitor.  Call them immediately if you see an orange light blinking on your monitor.   If your monitor falls off in less  than 4 days contact our Monitor department at [phone].  If your monitor becomes loose or falls off after 4 days call [employer] at [phone] for suggestions on securing your monitor.   Follow-Up: At [facility], you and your health needs are our priority.  As part of our continuing mission to provide you with exceptional heart care, we have created designated Provider Care Teams.  These Care Teams include your primary Cardiologist (physician) and Advanced Practice Providers (APPs -  Physician Assistants and Nurse Practitioners) who all work together to provide you with the care you need, when you need it.  We recommend signing up for the patient portal  called "MyChart".  Sign up information is provided on this After Visit Summary.  MyChart is used to connect with patients for Virtual Visits (Telemedicine).  Patients are able to view lab/test results, encounter notes, upcoming appointments, etc.  Non-urgent messages can be sent to your provider as well.   To learn more about what you can do with MyChart, go to [email or web address].    Your next appointment:   AS NEEDED   The format for your next appointment:   In Person  Provider: Dr. Harl Bowie

## 2022-09-09 NOTE — ED Notes (Signed)
Pt requested to be discharged due to wait time here at the facility for transfer to [facility]. Provider notified and will talk with Pt.

## 2022-09-09 NOTE — ED Notes (Signed)
Discharge Paperwork given and verbally understood.

## 2022-09-09 NOTE — Progress Notes (Unsigned)
Enrolled for [employer] to mail a ZIO XT long term holter monitor to the patients address on file.  

## 2022-09-09 NOTE — ED Provider Notes (Signed)
  Physical Exam  BP (!) 138/95 (BP Location: Right Arm)   Pulse (!) 58   Temp 98.2 F (36.8 C) (Oral)   Resp 13   Ht 5\' 8"  (1.727 m)   Wt 86.2 kg   SpO2 96%   BMI 28.89 kg/m   Physical Exam Constitutional:      General: He is not in acute distress.    Appearance: Normal appearance.  HENT:     Head: Normocephalic and atraumatic.     Nose: No congestion or rhinorrhea.  Eyes:     General:        Right eye: No discharge.        Left eye: No discharge.     Extraocular Movements: Extraocular movements intact.     Pupils: Pupils are equal, round, and reactive to light.  Cardiovascular:     Rate and Rhythm: Normal rate and regular rhythm.     Heart sounds: No murmur heard. Pulmonary:     Effort: No respiratory distress.     Breath sounds: No wheezing or rales.  Abdominal:     General: There is no distension.     Tenderness: There is no abdominal tenderness.  Musculoskeletal:        General: Normal range of motion.     Cervical back: Normal range of motion.  Skin:    General: Skin is warm and dry.  Neurological:     General: No focal deficit present.     Mental Status: He is alert.     Procedures  Procedures  ED Course / MDM    Medical Decision Making Amount and/or Complexity of Data Reviewed Labs: ordered. Radiology: ordered.  Risk Prescription drug management.   Patient received in handoff.  Currently boarding in the emergency department admitted for syncope.  I was called to the room at hour 18 of the patient's hospital/emergency department stay.  Patient is expressing wishes to be discharged at this time and follow-up outpatient with his primary care physician.  He states that he is on the phone with his primary care physician who will help arrange outpatient follow-up.  I had an in-depth discussion with the patient who states that he was on the phone when the event occurred and he had a large emotional stimulus with laughing prior to passing out.  With overall  negative work-up up until this point, this certainly seems consistent with a vasovagal event and although the patient would benefit from an echocardiogram, this could be performed on an outpatient basis.  I placed an urgent referral to the cardiologist and the patient was ultimately discharged from the emergency department.  He was given very strict return precautions which she voiced understanding       Teressa Lower, MD 09/09/22 [phone]

## 2022-09-13 ENCOUNTER — Telehealth: Payer: Self-pay | Admitting: Internal Medicine

## 2022-09-13 ENCOUNTER — Encounter: Payer: Self-pay | Admitting: Internal Medicine

## 2022-09-13 DIAGNOSIS — R55 Syncope and collapse: Secondary | ICD-10-CM | POA: Diagnosis not present

## 2022-09-13 NOTE — Telephone Encounter (Signed)
Spoke to patient advised Dr.Branch is out of office today.Your email was sent to earlier this afternoon.Message sent to [place]  for advice if ok to drive.

## 2022-09-13 NOTE — Telephone Encounter (Signed)
New Message:  .  Patient wants to ask Dr Harl Bowie, if it will be alright for him to drive to work tonight?

## 2022-09-14 ENCOUNTER — Encounter: Payer: Self-pay | Admitting: Internal Medicine
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
## 2022-09-28 NOTE — Telephone Encounter (Signed)
Please see MyChart Encounter.  

## 2022-10-01 ENCOUNTER — Encounter: Payer: Self-pay | Admitting: Internal Medicine

## 2022-11-26 ENCOUNTER — Other Ambulatory Visit: Payer: Self-pay | Admitting: Internal Medicine

## 2022-11-26 DIAGNOSIS — J449 Chronic obstructive pulmonary disease, unspecified: Secondary | ICD-10-CM

## 2022-11-26 DIAGNOSIS — J439 Emphysema, unspecified: Secondary | ICD-10-CM

## 2023-01-10 ENCOUNTER — Other Ambulatory Visit: Payer: BC Managed Care – PPO

## 2023-02-28 ENCOUNTER — Ambulatory Visit
Admission: RE | Admit: 2023-02-28 | Discharge: 2023-02-28 | Disposition: A | Discharge status: Home / Self Care | Payer: BC Managed Care – PPO | Source: Ambulatory Visit | Attending: Internal Medicine | Admitting: Internal Medicine

## 2023-02-28 DIAGNOSIS — J449 Chronic obstructive pulmonary disease, unspecified: Secondary | ICD-10-CM

## 2023-02-28 DIAGNOSIS — J439 Emphysema, unspecified: Secondary | ICD-10-CM

## 2023-08-31 ENCOUNTER — Encounter: Payer: Self-pay | Admitting: Pulmonary Disease

## 2023-08-31 ENCOUNTER — Ambulatory Visit (INDEPENDENT_AMBULATORY_CARE_PROVIDER_SITE_OTHER): Payer: BC Managed Care – PPO | Admitting: Pulmonary Disease

## 2023-08-31 ENCOUNTER — Other Ambulatory Visit: Payer: Self-pay

## 2023-08-31 VITALS — BP 122/82 | HR 93 | Ht 68.0 in | Wt 165.0 lb

## 2023-08-31 DIAGNOSIS — J849 Interstitial pulmonary disease, unspecified: Secondary | ICD-10-CM | POA: Diagnosis not present

## 2023-08-31 DIAGNOSIS — J439 Emphysema, unspecified: Secondary | ICD-10-CM | POA: Diagnosis not present

## 2023-08-31 MED ORDER — BUPROPION HCL ER (SR) 150 MG PO TB12: 150.0000 mg | ORAL_TABLET | Freq: Two times a day (BID) | ORAL | 0 refills | Status: DC

## 2023-08-31 MED ORDER — NICOTINE 21 MG/24HR TD PT24: 21.0000 mg | MEDICATED_PATCH | Freq: Every day | TRANSDERMAL | 0 refills | Status: AC

## 2023-08-31 NOTE — Progress Notes (Signed)
James Ingram    [id number]    1961-02-14  Primary Care Physician:Moreira, Channing Mutters, MD  Referring Physician: Ralene Ok, MD [address] [place],  [place] [address]  Chief complaint: Consult for emphysema, abnormal CT  HPI: 62 y.o. who  has a past medical history of Diabetes mellitus without complication (HCC) and High cholesterol.   Discussed the use of AI scribe software for clinical note transcription with the patient, who gave verbal consent to proceed.  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 He has been told he has COPD, but denies any breathing difficulties, even after strenuous yard work. The patient has a significant smoking history, having smoked a pack a day for the past ten years, and half a pack a day prior to that. He quit smoking for a period of time, but relapsed due to an association with drinking tequila. He is currently trying to quit again.  The patient also has diabetes and high cholesterol, but is not on any blood pressure medication. He has no known exposures to mold, dust, or feathers, and has no pets. 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 He has a family history of tuberculosis, lung cancer, heart disease, cervical cancer, and breast cancer.  The patient also reports nerve damage in his arms from work, but states this is only treated if his hands start to curl in. He denies any symptoms of arthritis, rashes, difficulty swallowing, dry mouth, or dry eyes. He has noticed a change in his fingers, which he attributes to smoking.   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  No feather pillows or comforters Smoking history: 25-pack-year smoker continues to smoke 1 pack/day Travel history: No significant travel history Relevant family history: Relevant family history of lung cancer,  tuberculosis.  Outpatient Encounter Medications as of 08/31/2023  Medication Sig   buPROPion (WELLBUTRIN SR) 150 MG 12 hr tablet Take 1 tablet (150 mg total) by mouth 2 (two) times daily. Initial: 150 mg once daily for 3 days; increase to 150 mg twice daily (maximum dose: 300 mg/day)   JANUVIA 100 MG tablet Take 100 mg by mouth daily.   metFORMIN (GLUCOPHAGE) 500 MG tablet Take 1,000 mg by mouth 2 (two) times daily.   nicotine (NICODERM CQ - DOSED IN MG/24 HOURS) 21 mg/24hr patch Place 1 patch (21 mg total) onto the skin daily.   omeprazole (PRILOSEC) 40 MG capsule Take 40 mg by mouth daily.   rosuvastatin (CRESTOR) 20 MG tablet Take 20 mg by mouth daily. (Patient not taking: Reported on 08/31/2023)   tadalafil (CIALIS) 20 MG tablet Take 20 mg by mouth daily. (Patient not taking: Reported on 08/31/2023)   [DISCONTINUED] MYRBETRIQ 25 MG TB24 tablet Take 25 mg by mouth daily. (Patient not taking: Reported on 09/09/2022)   [DISCONTINUED] pravastatin (PRAVACHOL) 40 MG tablet  (Patient not taking: Reported on 09/09/2022)   [DISCONTINUED] STAXYN 10 MG TBDP Take 1 tablet by mouth See admin instructions. (Patient not taking: Reported on 09/09/2022)   [DISCONTINUED] VIAGRA 100 MG tablet See admin instructions. (Patient not taking: Reported on 09/09/2022)   No facility-administered encounter medications on file as of 08/31/2023.    Allergies as of 08/31/2023   (No Known Allergies)    Past Medical History:  Diagnosis Date  Diabetes mellitus without complication (HCC)    High cholesterol     Past Surgical History:  Procedure Laterality Date   BACK SURGERY      Family History  Problem Relation Age of Onset   Cervical cancer Mother    Heart disease Father     Social History   Socioeconomic History   Marital status: Single    Spouse name: Not on file   Number of children: Not on file   Years of education: Not on file   Highest education level: Not on file  Occupational History   Not on  file  Tobacco Use   Smoking status: Former    Current packs/day: 1.00    Types: Cigarettes   Smokeless tobacco: Never  Substance and Sexual Activity   Alcohol use: No    Alcohol/week: 0.0 standard drinks of alcohol   Drug use: No   Sexual activity: Not Currently  Other Topics Concern   Not on file  Social History Narrative   Not on file   Social Determinants of Health   Financial Resource Strain: Not on file  Food Insecurity: Not on file  Transportation Needs: Not on file  Physical Activity: Not on file  Stress: Not on file  Social Connections: Not on file  Intimate Partner Violence: Not on file    Review of systems: Review of Systems  Constitutional: Negative for fever and chills.  HENT: Negative.   Eyes: Negative for blurred vision.  Respiratory: as per HPI  Cardiovascular: Negative for chest pain and palpitations.  Gastrointestinal: Negative for vomiting, diarrhea, blood per rectum. Genitourinary: Negative for dysuria, urgency, frequency and hematuria.  Musculoskeletal: Negative for myalgias, back pain and joint pain.  Skin: Negative for itching and rash.  Neurological: Negative for dizziness, tremors, focal weakness, seizures and loss of consciousness.  Endo/Heme/Allergies: Negative for environmental allergies.  Psychiatric/Behavioral: Negative for depression, suicidal ideas and hallucinations.  All other systems reviewed and are negative.  Physical Exam: Blood pressure 122/82, pulse 93, height 5\' 8"  (1.727 m), weight 165 lb (74.8 kg), SpO2 94%. Gen:      No acute distress HEENT:  EOMI, sclera anicteric Neck:     No masses; no thyromegaly Lungs:    Clear to auscultation bilaterally; normal respiratory effort CV:         Regular rate and rhythm; no murmurs Abd:      + bowel sounds; soft, non-tender; no palpable masses, no distension Ext:    No edema; adequate peripheral perfusion Skin:      Warm and dry; no rash Neuro: alert and oriented x 3 Psych: normal mood  and affect  Data Reviewed: Imaging: Screening CT chest 02/28/2023-subsegmental pulmonary nodule areas of groundglass attenuation, septal thickening in the mid to lower lungs, moderate emphysema. I have reviewed the images personally.  PFTs:  Labs:  Assessment and Plan COPD/Emphysema Asymptomatic with a history of smoking. CT scan shows changes consistent with emphysema.  No need for inhalers at present  -Schedule lung function tests in 3 months to evaluate lung function. -Continue smoking cessation efforts.  Interstitial Lung Disease CT scan shows inflammation and scarring in the lungs, suggestive of interstitial lung disease. No known exposures or symptoms of autoimmune disease.  Findings are seen back in screening CTs of the chest in 2019 and appears to be stable since then.  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  Could be IPF versus smoking-related changes.  He does not have significant exposures or signs and symptoms of  connective tissue disease  -Order high-resolution CT scan of the chest for a more detailed evaluation. -Order labs to screen for autoimmune conditions that could cause interstitial lung disease.  Tobacco Use Disorder Current smoker with a history of failed quit attempts using Chantix due to side effects. Has had some success with nicotine patches in the past.  -Prescribe Bupropion and nicotine patches to aid in smoking cessation efforts.  Follow-up Review results of labs and high-resolution CT scan. Monitor progress with smoking cessation efforts.   Recommendations: High-res CT PFTs Bupropion, nicotine patches  Chilton Greathouse MD [facility] 08/31/2023, 3:12 PM  CC: Ralene Ok, MD

## 2023-08-31 NOTE — Patient Instructions (Signed)
VISIT SUMMARY:  During your visit, we discussed your recent CT scan which showed some changes in your lungs. We talked about your history of smoking and your current efforts to quit. We also discussed your diabetes and high cholesterol, but noted that you are not currently on any blood pressure medication. We did not find any symptoms of arthritis, rashes, difficulty swallowing, dry mouth, or dry eyes. We also noted some nerve damage in your arms from work, which you said is only treated if your hands start to curl in.  YOUR PLAN:  -COPD/EMPHYSEMA: COPD, or Chronic Obstructive Pulmonary Disease, is a lung disease that makes it hard to breathe and is often caused by smoking. Your CT scan showed changes in your lungs that are consistent with emphysema, a type of COPD. We will schedule lung function tests in 3 months to check how well your lungs are working. It's important that you continue your efforts to quit smoking.  -INTERSTITIAL LUNG DISEASE: Interstitial lung disease is a group of lung disorders that cause scarring (fibrosis) of the lungs. Your CT scan showed inflammation and scarring in your lungs, which suggests you may have this condition. We will order a high-resolution CT scan of your chest for a more detailed look at your lungs, and we will also order labs to check for autoimmune conditions that could cause interstitial lung disease.  -TOBACCO USE DISORDER: Tobacco use disorder is a dependence on nicotine, which is found in tobacco. You have a history of smoking and have tried to quit in the past. We will prescribe Bupropion and nicotine patches to help you with your efforts to quit smoking.  INSTRUCTIONS:  Please make sure to schedule your lung function tests and high-resolution CT scan. Also, pick up your prescription for Bupropion and nicotine patches from your pharmacy. If you do not hear from our schedulers soon then let us know. We will review the results of your labs and CT scan at  your next visit, and we will also check on your progress with quitting smoking.

## 2023-09-02 LAB — ANA+ENA+DNA/DS+ANTICH+CENTR
ANA Titer 1: NEGATIVE
Anti JO-1: <0.2 AI (ref 0.0–0.9)
Centromere Ab Screen: <0.2 AI (ref 0.0–0.9)
Chromatin Ab SerPl-aCnc: <0.2 AI (ref 0.0–0.9)
ENA RNP Ab: <0.2 AI (ref 0.0–0.9)
ENA SM Ab Ser-aCnc: <0.2 AI (ref 0.0–0.9)
ENA SSA (RO) Ab: 0.7 AI (ref 0.0–0.9)
ENA SSB (LA) Ab: <0.2 AI (ref 0.0–0.9)
Scleroderma (Scl-70) (ENA) Antibody, IgG: 0.4 AI (ref 0.0–0.9)
dsDNA Ab: <1 [IU]/mL (ref 0–9)

## 2023-09-02 LAB — ANA,IFA RA DIAG PNL W/RFLX TIT/PATN
Anti Nuclear Antibody (ANA): NEGATIVE
Cyclic Citrullin Peptide Ab: <16 U
Rheumatoid fact SerPl-aCnc: <10 [IU]/mL (ref <14)

## 2023-09-15 ENCOUNTER — Ambulatory Visit
Admission: RE | Admit: 2023-09-15 | Discharge: 2023-09-15 | Disposition: A | Discharge status: Home / Self Care | Payer: BC Managed Care – PPO | Source: Ambulatory Visit | Attending: Pulmonary Disease | Admitting: Pulmonary Disease

## 2023-09-15 DIAGNOSIS — J849 Interstitial pulmonary disease, unspecified: Secondary | ICD-10-CM

## 2023-11-22 ENCOUNTER — Other Ambulatory Visit: Payer: Self-pay | Admitting: Pulmonary Disease

## 2024-02-15 ENCOUNTER — Other Ambulatory Visit: Payer: Self-pay | Admitting: Pulmonary Disease

## 2024-07-15 ENCOUNTER — Ambulatory Visit: Payer: Self-pay | Admitting: Pulmonary Disease

## 2024-07-15 DIAGNOSIS — J849 Interstitial pulmonary disease, unspecified: Secondary | ICD-10-CM

## 2024-08-06 ENCOUNTER — Ambulatory Visit: Admitting: Pulmonary Disease

## 2024-08-06 DIAGNOSIS — J849 Interstitial pulmonary disease, unspecified: Secondary | ICD-10-CM | POA: Diagnosis not present

## 2024-08-06 LAB — PULMONARY FUNCTION TEST
DL/VA % pred: 62 %
DL/VA: 2.66 ml/min/mmHg/L
DLCO unc % pred: 49 %
DLCO unc: 12.86 ml/min/mmHg
FEF 25-75 Post: 2.81 L/s
FEF 25-75 Pre: 2.55 L/s
FEF2575-%Change-Post: 9 %
FEF2575-%Pred-Post: 102 %
FEF2575-%Pred-Pre: 93 %
FEV1-%Change-Post: 1 %
FEV1-%Pred-Post: 88 %
FEV1-%Pred-Pre: 87 %
FEV1-Post: 2.93 L
FEV1-Pre: 2.9 L
FEV1FVC-%Change-Post: 0 %
FEV1FVC-%Pred-Pre: 102 %
FEV6-%Change-Post: 1 %
FEV6-%Pred-Post: 90 %
FEV6-%Pred-Pre: 88 %
FEV6-Post: 3.78 L
FEV6-Pre: 3.73 L
FEV6FVC-%Change-Post: 0 %
FEV6FVC-%Pred-Post: 104 %
FEV6FVC-%Pred-Pre: 104 %
FVC-%Change-Post: 1 %
FVC-%Pred-Post: 86 %
FVC-%Pred-Pre: 85 %
FVC-Post: 3.82 L
FVC-Pre: 3.75 L
Post FEV1/FVC ratio: 77 %
Post FEV6/FVC ratio: 99 %
Pre FEV1/FVC ratio: 77 %
Pre FEV6/FVC Ratio: 100 %
RV % pred: 76 %
RV: 1.64 L
TLC % pred: 79 %
TLC: 5.22 L

## 2024-08-06 NOTE — Patient Instructions (Signed)
 Full pft performed today

## 2024-08-06 NOTE — Progress Notes (Signed)
 Full pft performed today

## 2024-08-13 ENCOUNTER — Ambulatory Visit (HOSPITAL_COMMUNITY)

## 2024-08-15 ENCOUNTER — Ambulatory Visit (HOSPITAL_COMMUNITY)

## 2024-09-03 ENCOUNTER — Ambulatory Visit: Admitting: Pulmonary Disease

## 2024-10-08 ENCOUNTER — Telehealth: Payer: Self-pay

## 2024-10-08 ENCOUNTER — Ambulatory Visit: Admitting: Pulmonary Disease

## 2024-10-08 NOTE — Telephone Encounter (Signed)
 Copied from CRM [id number]. Topic: Clinical - Request for Lab/Test Order >> Oct 08, 2024  9:50 AM Corean SAUNDERS wrote: Reason for CRM: [facility] with [facility] Is requesting confrimation of if the patients CT CHEST High Resolution ordered by Dr. Theophilus is with or without contrast?  [facility] can be reached at [phone]  (Can speak to anyone)  Imaging department was called back, on patient's behalf. Needing Ct chest High resolution(without contrast) order fax to them. Order signed and faxed to [phone]. Patient will get this done at [facility], they will fax us  patient's report when this is done. NFN.

## 2024-10-08 NOTE — Telephone Encounter (Signed)
 LMTCB making patient aware he will need to get CT on a disk for provider to view.  Copied from CRM [id number]. Topic: Clinical - Medication Question >> Oct 05, 2024  4:06 PM Rozanna MATSU wrote: Reason for CRM: [facility] [phone] stated pt wants to have his CT Chest done at this location but they need to know if its with or without contrast. Please reach and advise. They are requesting a copy of the order. Fax#[phone]

## 2024-11-08 ENCOUNTER — Encounter: Payer: Self-pay | Admitting: Pulmonary Disease

## 2024-11-08 ENCOUNTER — Inpatient Hospital Stay
Admission: RE | Admit: 2024-11-08 | Discharge: 2024-11-08 | Disposition: A | Discharge status: Home / Self Care | Payer: Self-pay | Source: Ambulatory Visit | Attending: Pulmonary Disease | Admitting: Pulmonary Disease

## 2024-11-08 ENCOUNTER — Ambulatory Visit: Admitting: Pulmonary Disease

## 2024-11-08 VITALS — BP 106/68 | HR 61 | Temp 97.8°F | Ht 68.0 in | Wt 168.0 lb

## 2024-11-08 DIAGNOSIS — J439 Emphysema, unspecified: Secondary | ICD-10-CM | POA: Diagnosis not present

## 2024-11-08 DIAGNOSIS — J8410 Pulmonary fibrosis, unspecified: Secondary | ICD-10-CM | POA: Diagnosis not present

## 2024-11-08 DIAGNOSIS — K219 Gastro-esophageal reflux disease without esophagitis: Secondary | ICD-10-CM

## 2024-11-08 DIAGNOSIS — F17210 Nicotine dependence, cigarettes, uncomplicated: Secondary | ICD-10-CM

## 2024-11-08 NOTE — Progress Notes (Signed)
 James Ingram    [id number]    Mar 13, 1961  Primary Care Physician:Moreira, Gaither, MD  Referring Physician: Valma Gaither, MD [address] [place],  [place] [address]  Chief complaint: Follow-up for combined pulmonary fibrosis emphysema  HPI: 63 y.o. who  has a past medical history of Diabetes mellitus without complication (HCC) and High cholesterol.   Discussed the use of AI scribe software for clinical note transcription with the patient, who gave verbal consent to proceed.  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 He has been told he has COPD, but denies any breathing difficulties, even after strenuous yard work. The patient has a significant smoking history, having smoked a pack a day for the past ten years, and half a pack a day prior to that. He quit smoking for a period of time, but relapsed due to an association with drinking tequila. He is currently trying to quit again.  Interval history: Discussed the use of AI scribe software for clinical note transcription with the patient, who gave verbal consent to proceed. History of Present Illness James Ingram is a 63 year old male with combined emphysema and pulmonary fibrosis who presents for follow-up of his lung condition.  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  No feather pillows or comforters Smoking history: 25-pack-year smoker continues to smoke 1 pack/day Travel history: No significant travel history Relevant family history: Relevant family history of lung cancer, tuberculosis.  Outpatient Encounter Medications as of 11/08/2024  Medication Sig   JARDIANCE 25 MG TABS tablet Take 25 mg by mouth every morning.   metFORMIN (GLUCOPHAGE) 500 MG tablet Take 1,000 mg by mouth 2 (two) times daily.   omeprazole (PRILOSEC) 40 MG capsule Take 40 mg by mouth daily. (Patient taking differently: Take 40 mg by mouth daily. prn)   rosuvastatin (CRESTOR) 20 MG tablet Take 20 mg by mouth daily.   buPROPion  (WELLBUTRIN  SR) 150 MG 12 hr tablet TAKE 1 TABLET (150 MG TOTAL) BY MOUTH 2 (TWO) TIMES DAILY. INITIAL: 150 MG ONCE DAILY FOR 3 DAYS INCREASE TO 150 MG TWICE DAILY (MAXIMUM DOSE: 300 MG/DAY)   JANUVIA 100 MG tablet Take 100 mg by mouth daily.   nicotine  (NICODERM CQ  - DOSED IN MG/24 HOURS) 21 mg/24hr patch Place 1 patch (21 mg total) onto the skin daily.   tadalafil (CIALIS) 20 MG tablet Take 20 mg by mouth daily. (Patient not taking: Reported on 08/31/2023)   No facility-administered encounter medications on file as of 11/08/2024.   Vitals:   11/08/24 1547  BP: 106/68  Pulse: 61  Temp: 97.8 F (36.6 C)  Height: 5' 8 (1.727 m)  Weight: 168 lb (76.2 kg)  SpO2: 95%  TempSrc: Oral  BMI (Calculated): 25.55     Physical Exam GEN: No acute distress CV: Regular rate and rhythm no murmurs LUNGS: Clear to auscultation bilaterally normal respiratory effort SKIN JOINTS: Warm and dry no rash    Data Reviewed: Imaging: Screening CT chest 02/28/2023-subsegmental pulmonary nodule areas of groundglass attenuation, septal thickening in the mid to lower lungs, moderate emphysema.  High-resolution  CT 09/15/2023-basilar ground glass with reticulation, mild traction bronchiectasis similar to 2022.  Alternate diagnosis  High-resolution CT 10/16/2024 [[facility]] - 1.  Findings suggestive of non-IPF fibrotic interstitial lung disease. Differential considerations include combined pulmonary fibrosis and emphysema (CBFE), NSIP and DIP.  2.  Multiple prominent mediastinal lymph nodes. Attention on follow-up.  3.  Circumferential thickening of the lower esophagus as can be seen in the setting of chronic reflux.  I have reviewed the images personally.  PFTs: 08/06/2024 FVC 3.82 [86%], FEV1 2.93 [88%], F/F77, TLC 5.22 [79%], DLCO 12.86 [49%] Moderate diffusion defect  Labs: CTD serologies 08/31/2023- negative Assessment & Plan Combined pulmonary fibrosis and emphysema CT scan shows scarring in the lungs, consistent with combined emphysema and pulmonary fibrosis. The pattern is nonspecific, possibly related to smoking related interstitial lung disease or other exposures.  Reviewed the CT scan from [facility] last month.  There is no change since 2022. Lung function tests indicate reduced lung capacity and diffusion capacity, likely due to the combined conditions. Negative autoimmune disease tests suggest smoking as a probable cause.  No exposures noted. - Continue to monitor lung function and symptoms. - Will repeat CT scan in one year. - Scheduled follow-up appointment in six months.  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 Previously quit for 3-4 months but resumed smoking. Has tried Chantix, bupropion , and nicotine  patches in the past. Prefers to quit on his own without pharmacological aids.  Time spent counseling-5 minutes.  Reassess at return visit - Encouraged continued attempts to quit smoking independently. - Scheduled follow-up appointment in six months.  GERD On Prilosec  Recommendations: Smoking cessation Return to clinic in 6 months  Sargent Mankey  MD [facility] 11/08/2024, 4:12 PM  CC: Valma Carwin, MD

## 2024-11-08 NOTE — Patient Instructions (Signed)
°  VISIT SUMMARY: You had a follow-up visit to check on your lung condition, which includes combined emphysema and pulmonary fibrosis. Your lung function remains stable, and we discussed your ongoing efforts to quit smoking.  YOUR PLAN: COMBINED PULMONARY FIBROSIS AND EMPHYSEMA: You have scarring in your lungs and reduced lung capacity due to combined emphysema and pulmonary fibrosis. This condition has remained stable since your diagnosis in 2024. -We will continue to monitor your lung function and symptoms. -You will have another CT scan in one year. -Your next follow-up appointment is scheduled in six months.  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 You have previously tried to quit using medications but prefer to quit on your own now. -Keep trying to quit smoking on your own. -Your next follow-up appointment is scheduled in six months.

## 2025-05-13 ENCOUNTER — Ambulatory Visit: Admitting: Pulmonary Disease
